# Patient Record
Sex: Male | Born: 1995 | Race: Asian | Hispanic: Yes | Marital: Single | Smoking: Former smoker
Health system: Southern US, Community
[De-identification: ages and names within clinical notes are randomized; demographics above are authoritative.]

---

## 2016-05-02 ENCOUNTER — Emergency Department (HOSPITAL_COMMUNITY): Payer: BLUE CROSS/BLUE SHIELD

## 2016-05-02 ENCOUNTER — Encounter (HOSPITAL_COMMUNITY): Payer: Self-pay | Admitting: Vascular Surgery

## 2016-05-02 ENCOUNTER — Emergency Department (HOSPITAL_COMMUNITY)
Admission: EM | Admit: 2016-05-02 | Discharge: 2016-05-02 | Disposition: A | Discharge status: Home / Self Care | Payer: BLUE CROSS/BLUE SHIELD | Attending: Emergency Medicine | Admitting: Emergency Medicine

## 2016-05-02 DIAGNOSIS — M549 Dorsalgia, unspecified: Secondary | ICD-10-CM | POA: Diagnosis not present

## 2016-05-02 DIAGNOSIS — Y939 Activity, unspecified: Secondary | ICD-10-CM | POA: Insufficient documentation

## 2016-05-02 DIAGNOSIS — Z87891 Personal history of nicotine dependence: Secondary | ICD-10-CM | POA: Diagnosis not present

## 2016-05-02 DIAGNOSIS — Y92410 Unspecified street and highway as the place of occurrence of the external cause: Secondary | ICD-10-CM | POA: Insufficient documentation

## 2016-05-02 DIAGNOSIS — Y999 Unspecified external cause status: Secondary | ICD-10-CM | POA: Insufficient documentation

## 2016-05-02 DIAGNOSIS — S199XXA Unspecified injury of neck, initial encounter: Secondary | ICD-10-CM | POA: Diagnosis not present

## 2016-05-02 MED ORDER — METHOCARBAMOL 750 MG PO TABS: 750.0000 mg | ORAL_TABLET | Freq: Four times a day (QID) | ORAL | 0 refills | Status: AC

## 2016-05-02 MED ORDER — NAPROXEN 500 MG PO TABS: 500.0000 mg | ORAL_TABLET | Freq: Two times a day (BID) | ORAL | 0 refills | Status: AC

## 2016-05-02 MED ORDER — OXYCODONE-ACETAMINOPHEN 5-325 MG PO TABS
1.0000 | ORAL_TABLET | Freq: Once | ORAL | Status: AC
  Administered 2016-05-02: 1 via ORAL
  Filled 2016-05-02: qty 1

## 2016-05-02 MED ORDER — IBUPROFEN 200 MG PO TABS
600.0000 mg | ORAL_TABLET | Freq: Once | ORAL | Status: AC
  Administered 2016-05-02: 600 mg via ORAL
  Filled 2016-05-02: qty 1
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
## 2016-05-02 NOTE — ED Notes (Signed)
Declined W/C at D/C and was escorted to lobby by RN. 
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
